# Patient Record
Sex: Male | Born: 1975 | Race: White | Hispanic: No | Marital: Single
Health system: Southern US, Community
[De-identification: ages and names within clinical notes are randomized; demographics above are authoritative.]

---

## 2018-06-26 ENCOUNTER — Other Ambulatory Visit: Payer: Self-pay

## 2018-06-26 ENCOUNTER — Encounter: Payer: Self-pay | Admitting: Emergency Medicine

## 2018-06-26 ENCOUNTER — Emergency Department
Admission: EM | Admit: 2018-06-26 | Discharge: 2018-06-26 | Disposition: A | Discharge status: Home / Self Care | Payer: Self-pay | Attending: Emergency Medicine | Admitting: Emergency Medicine

## 2018-06-26 ENCOUNTER — Emergency Department: Payer: Self-pay

## 2018-06-26 DIAGNOSIS — N50819 Testicular pain, unspecified: Secondary | ICD-10-CM

## 2018-06-26 DIAGNOSIS — N509 Disorder of male genital organs, unspecified: Secondary | ICD-10-CM | POA: Insufficient documentation

## 2018-06-26 DIAGNOSIS — N5089 Other specified disorders of the male genital organs: Secondary | ICD-10-CM

## 2018-06-26 DIAGNOSIS — N452 Orchitis: Secondary | ICD-10-CM

## 2018-06-26 LAB — CHLAMYDIA/NGC RT PCR (ARMC ONLY)
Chlamydia Tr: NOT DETECTED
N GONORRHOEAE: NOT DETECTED

## 2018-06-26 LAB — URINALYSIS, ROUTINE W REFLEX MICROSCOPIC
Bilirubin Urine: NEGATIVE
GLUCOSE, UA: NEGATIVE mg/dL
HGB URINE DIPSTICK: NEGATIVE
Ketones, ur: 5 mg/dL — AB
Leukocytes, UA: NEGATIVE
Nitrite: NEGATIVE
PROTEIN: NEGATIVE mg/dL
SPECIFIC GRAVITY, URINE: 1.008 (ref 1.005–1.030)
pH: 6 (ref 5.0–8.0)

## 2018-06-26 MED ORDER — LEVOFLOXACIN 500 MG PO TABS: 500.0000 mg | ORAL_TABLET | Freq: Every day | ORAL | 0 refills | Status: DC

## 2018-06-26 MED ORDER — LEVOFLOXACIN 750 MG PO TABS
750.0000 mg | ORAL_TABLET | Freq: Once | ORAL | Status: AC
  Administered 2018-06-26: 750 mg via ORAL
  Filled 2018-06-26: qty 1

## 2018-06-26 MED ORDER — LEVOFLOXACIN 500 MG PO TABS: 500.0000 mg | ORAL_TABLET | Freq: Every day | ORAL | 0 refills | Status: AC

## 2018-06-26 NOTE — ED Notes (Signed)
Pt given warm blanket and remote. Pt updated on wait time for test results

## 2018-06-26 NOTE — ED Notes (Signed)
Assisted MD Sharma CovertNorman with testicular exam. Pt tolerated well.

## 2018-06-26 NOTE — ED Provider Notes (Addendum)
[facility] Emergency Department Provider Note  ____________________________________________  Time seen: Approximately 3:23 PM  I have reviewed the triage vital signs and the nursing notes.   HISTORY  Chief Complaint Testicle Pain    HPI Antonio Gonzales is a 42 y.o. male the prior history of right hydrocele status post resection presenting with left testicular pain and mass.  The patient reports that for the past several weeks, he has noticed a small mass which is getting progressively larger on the superior surface of the left testicle.  Over the past 3 to 4 days, the patient has been having associated left testicular pain.  He has not had any penile discharge, nausea or vomiting, fevers or chills.  He reports he is sexually active with his wife only, and that he is in a monogamous relationship.  History reviewed. No pertinent past medical history.  There are no active problems to display for this patient.       Allergies Patient has no allergy information on record.  History reviewed. No pertinent family history.  Social History Social History   Tobacco Use  . Smoking status: Not on file  Substance Use Topics  . Alcohol use: Not on file  . Drug use: Not on file    Review of Systems Constitutional: No fever/chills. ENT: No sore throat. No congestion or rhinorrhea. Cardiovascular: Denies chest pain. Denies palpitations. Respiratory: Denies shortness of breath.  No cough. Gastrointestinal: No abdominal pain.  No nausea, no vomiting.  No diarrhea.  No constipation. Genitourinary: Negative for dysuria.  If or hematuria.  No penile discharge.  Positive left testicular mass which is progressively enlarging.  Positive left testicular pain.  Testicular injury. Musculoskeletal: Negative for back pain. Skin: Negative for rash. Neurological: Negative for headaches. No focal numbness, tingling or weakness.      ____________________________________________   PHYSICAL EXAM:  VITAL SIGNS: ED Triage Vitals  Enc Vitals Group     BP 06/26/18 1305 115/72     Pulse Rate 06/26/18 1305 67     Resp 06/26/18 1305 18     Temp 06/26/18 1305 98.4 F (36.9 C)     Temp Source 06/26/18 1305 Oral     SpO2 06/26/18 1305 98 %     Weight 06/26/18 1307 250 lb (113.4 kg)     Height 06/26/18 1307 5\' 10"  (1.778 m)     Head Circumference --      Peak Flow --      Pain Score 06/26/18 1305 3     Pain Loc --      Pain Edu? --      Excl. in GC? --     Constitutional: Alert and oriented. Answers questions appropriately. Eyes: Conjunctivae are normal.  EOMI. No scleral icterus. Head: Atraumatic. Nose: No congestion/rhinnorhea. Mouth/Throat: Mucous membranes are moist.  Neck: No stridor.  Supple.   Cardiovascular: Normal rate Respiratory: Normal respiratory effort.  Gastrointestinal: Soft, nontender and nondistended.  No guarding or rebound.  No peritoneal signs. Genitourinary: Normal-appearing external genitalia.  The penis is without any external lesions, and has no evidence of discharge.  The testicles have a normal lie with left testicle hanging lower than the right.  The right testicle is approximately two thirds the size of the left testicle.  The patient has no palpable masses on the testicles.  He does have some mild tenderness to palpation on the right testicle.  The patient has no palpable  inguinal hernias. Musculoskeletal: No LE edema. Neurologic:  A&Ox3.  Speech is clear.  Face and smile are symmetric.  EOMI.  Moves all extremities well. Skin:  Skin is warm, dry and intact. No rash noted. Psych: Normal mood and affect.  Good judgement.  ____________________________________________   LABS (all labs ordered are listed, but only abnormal results are displayed)  Labs Reviewed  URINALYSIS, ROUTINE W REFLEX MICROSCOPIC - Abnormal; Notable for the following components:      Result Value   Color,  Urine YELLOW (*)    APPearance CLEAR (*)    Ketones, ur 5 (*)    All other components within normal limits  CHLAMYDIA/NGC RT PCR [facility] ONLY)   ____________________________________________  EKG  None ____________________________________________  RADIOLOGY  US Scrotum W/doppler  Result Date: 06/26/2018 CLINICAL DATA:  42 y/o M; lung and pain of the left testicle for 2 weeks. EXAM: SCROTAL ULTRASOUND DOPPLER ULTRASOUND OF THE TESTICLES TECHNIQUE: Complete ultrasound examination of the testicles, epididymis, and other scrotal structures was performed. Color and spectral Doppler ultrasound were also utilized to evaluate blood flow to the testicles. COMPARISON:  None. FINDINGS: Right testicle Measurements: 4.6 x 1.8 x 3.4 cm. No microlithiasis. Irregularly marginated hypoechoic lesion within the pole of the testicle measuring 2.9 x 0.9 x 1.0 cm. Left testicle Measurements: 4.7 x 2.4 x 3.4 cm. No mass or microlithiasis visualized. Right epididymis:  Normal in size and appearance. Left epididymis:  Normal in size.  8 mm simple epididymal cyst. Hydrocele:  Small left hydrocele. Varicocele:  None visualized. Pulsed Doppler interrogation of both testes demonstrates normal low resistance arterial and venous waveforms are present bilaterally. There is asymmetrically increased blood flow within the left testicle on color Doppler. IMPRESSION: 1. Mildly increased vascularity of the left testicle on color Doppler compatible with orchitis. No findings of torsion at this time. 2. Irregularly marginated hypoechoic lesion in the right testicle measuring up to 2.9 cm. Findings may represent sequelae of old trauma, infarct, or hematoma. Testicular carcinoma is not excluded. Clinical correlation recommended. Electronically Signed   By: Mitzi Hansen M.D.   On: 06/26/2018 14:06    ____________________________________________   PROCEDURES  Procedure(s) performed: None  Procedures  Critical  Care performed: No ____________________________________________   INITIAL IMPRESSION / ASSESSMENT AND PLAN / ED COURSE  Pertinent labs & imaging results that were available during my care of the patient were reviewed by me and considered in my medical decision making (see chart for details).  42 y.o. male with a history of prior right hydrocele removal presenting with left testicular pain, and enlarging mass.  Overall, the patient is hemodynamically stable and has no clinical exam findings or history that is suggestive of a toxic infection.  His testicular exam is not consistent with testicular torsion.  His ultrasound does show increased blood flow of the left testicle which would be consistent with orchitis.  The patient states that he is monogamous, as is his wife, so we will get gonorrhea and Chlamydia testing to help determine antibiotic choice.  The patient also has a 2.9 cm mass in the right; this could be from his prior hydrocele surgery, but I have encouraged him to follow-up with a urologist as soon as he returns back to [place] where he is from, within the next 2 to 3 weeks, for evaluation by urologist; he understands the small risk that a mass in the testicle could be a carcinoma.  ----------------------------------------- 5:22 PM on 06/26/2018 -----------------------------------------  The patient's gonorrhea and Chlamydia testing are negative.  At this time, we  will treat him for GU pathogens and he will be placed on a 10-day course of Levaquin.  I have discussed follow-up instructions as well as return precautions with the patient.  ____________________________________________  FINAL CLINICAL IMPRESSION(S) / ED DIAGNOSES  Final diagnoses:  Orchitis  Mass of right testicle         NEW MEDICATIONS STARTED DURING THIS VISIT:  New Prescriptions   No medications on file      Rockne MenghiniNorman, Anne-Caroline, MD 06/26/18 1529    Rockne MenghiniNorman, Anne-Caroline, MD 06/26/18 [phone]

## 2018-06-26 NOTE — ED Notes (Addendum)
See triage note. Pt c/o pain in left testicle x 1 week; progressively "more sore and throbbing."

## 2018-06-26 NOTE — ED Triage Notes (Signed)
Pt presents to ED via POV with c/o of left testicular pain and lump. Pt states lump has been present for about 6 months, but shooting pain started two days ago that radiates to his stomach. 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 Pt in NAD at this time.

## 2018-06-26 NOTE — ED Notes (Signed)
Per lab, they will add-on chlamydia/NGC to urine already sent down. MD aware.

## 2018-06-26 NOTE — Discharge Instructions (Addendum)
Today you will be treated for your testicular infection with Levaquin; please take the entire course even if you are feeling better.  If your symptoms persist, you may need a longer course and your urologist can write you for an extension of the prescription.  Please also follow-up with the urologist regarding your abnormal ultrasound which showed a 2.9 cm lesion on your right testicle; it is likely that this is from your prior surgery but other possibilities include cancer and will need to be evaluated.  Please call medical records to have your records printed or sent to your urologist for follow-up.  Return to the emergency department if you develop severe pain, changes in the color or size of your scrotum, lightheadedness or fainting, fever, or any other symptoms concerning to you.

## 2020-04-17 IMAGING — US US SCROTUM W/ DOPPLER COMPLETE
1 series · 13 of 25 positions shown · non-contrast
Comparison: None.

CLINICAL DATA: 42 y/o M; lung and pain of the left testicle for 2
weeks.

EXAM:
SCROTAL ULTRASOUND
DOPPLER ULTRASOUND OF THE TESTICLES
TECHNIQUE: Complete ultrasound examination of the testicles, epididymis, and
other scrotal structures was performed. Color and spectral Doppler
ultrasound were also utilized to evaluate blood flow to the
testicles.

[Series 1: us scrotum w/ doppler complete · 0.07mm/px · 13 of 104 slices shown]
[im 1/104]
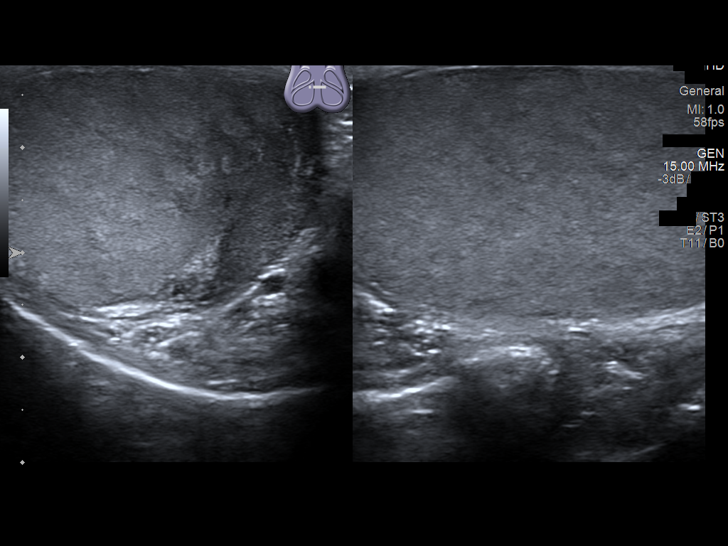
[im 9/104]
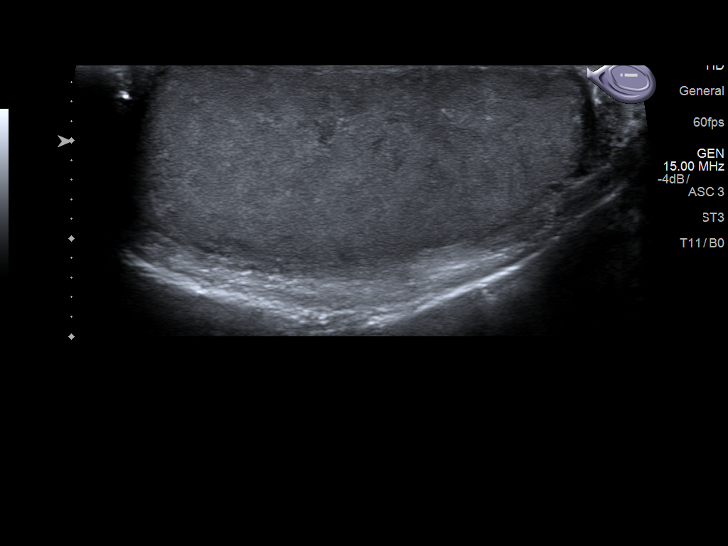
[im 18/104]
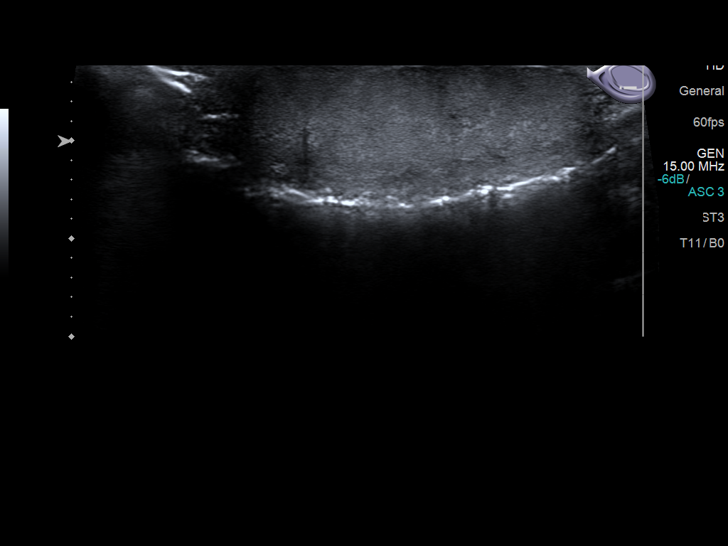
[im 26/104]
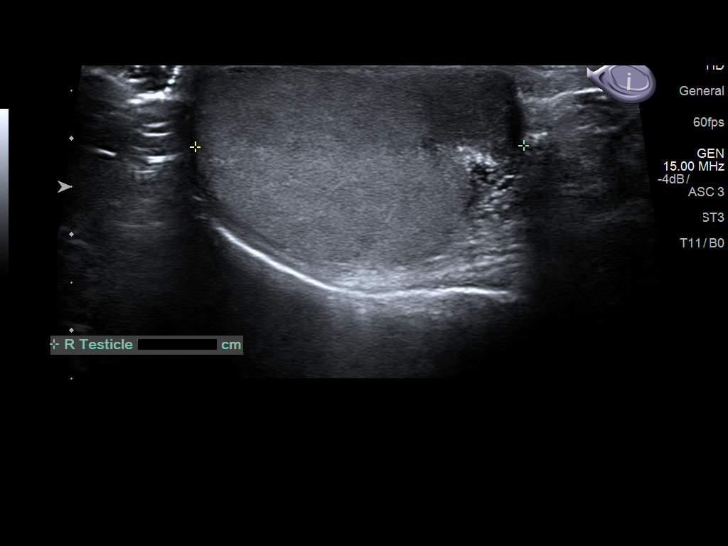
[im 35/104]
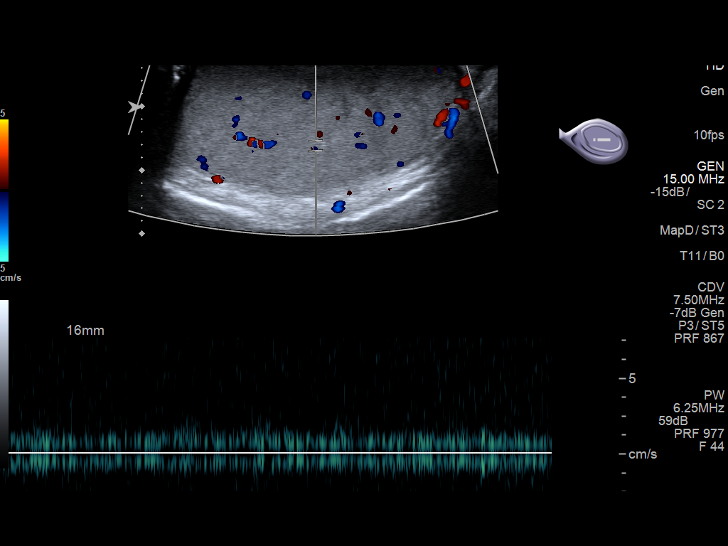
[im 43/104]
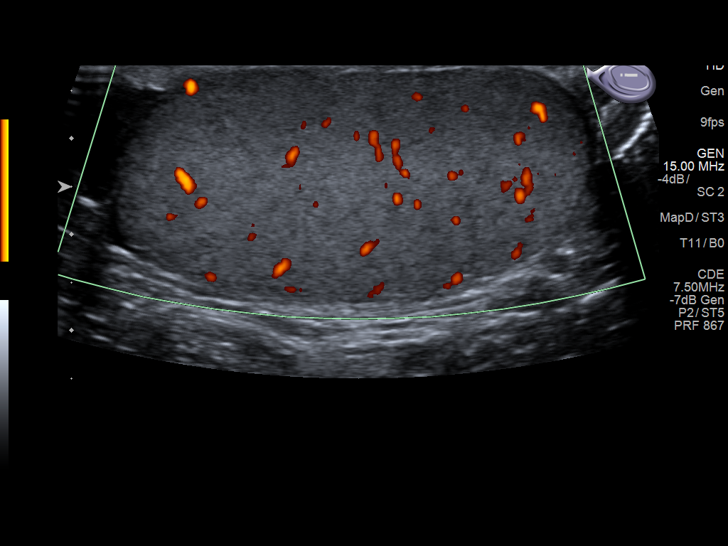
[im 52/104]
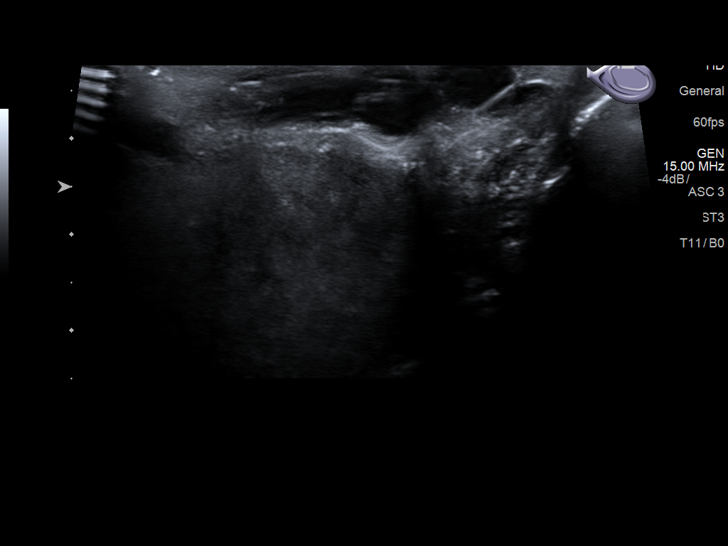
[im 61/104]
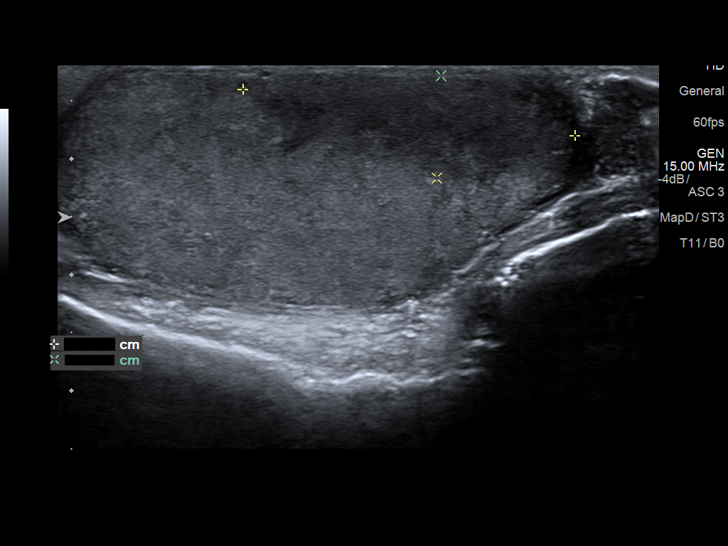
[im 69/104]
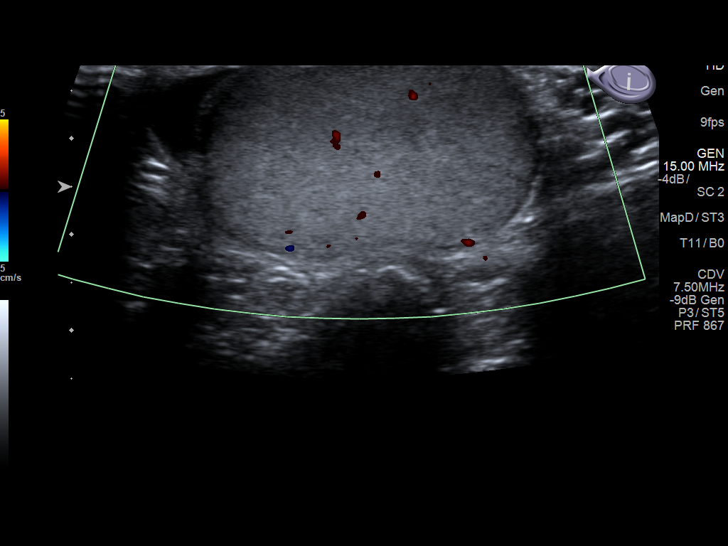
[im 78/104]
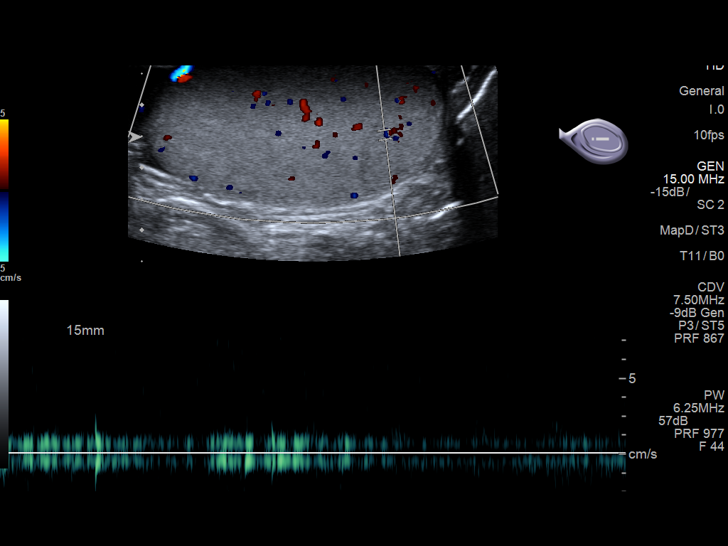
[im 86/104]
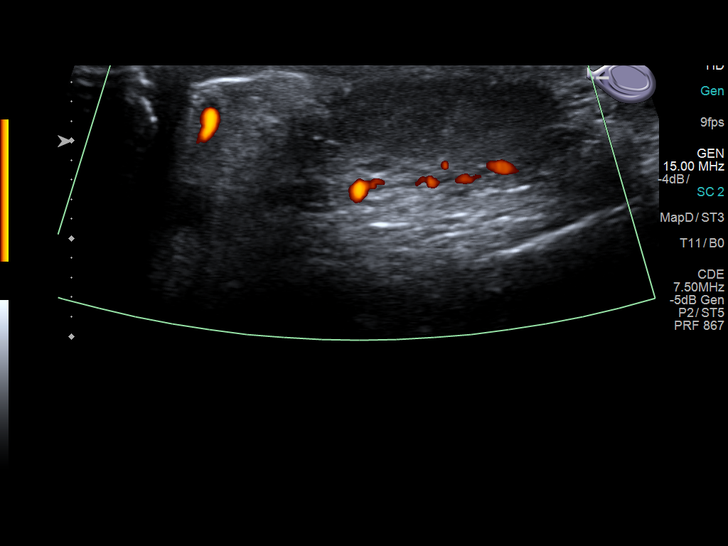
[im 95/104]
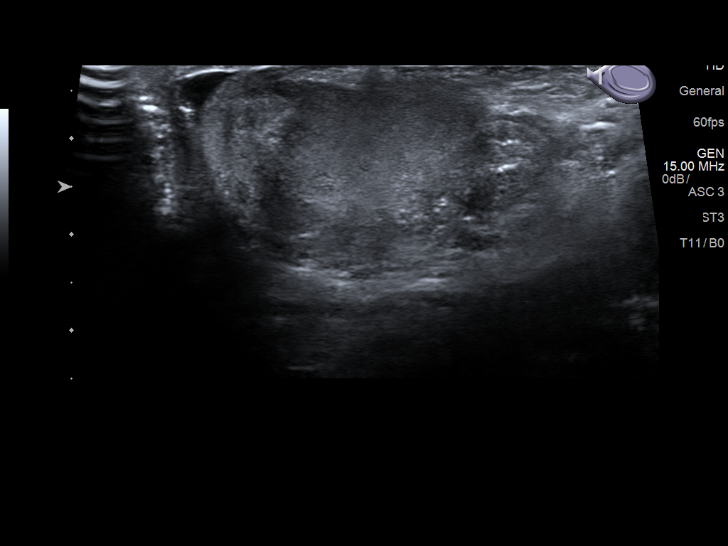
[im 104/104]
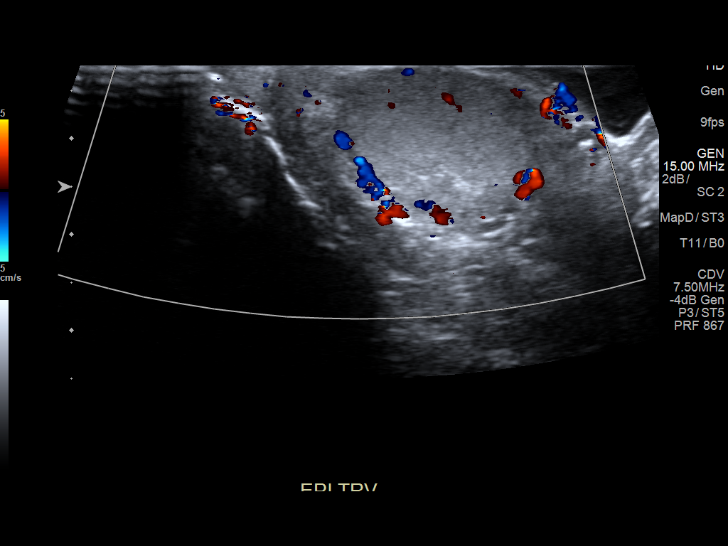

[13 of 25 positions shown; findings below may reference images not displayed]

FINDINGS: Right testicle

Measurements: 4.6 x 1.8 x 3.4 cm. No microlithiasis. Irregularly
marginated hypoechoic lesion within the pole of the testicle
measuring 2.9 x 0.9 x 1.0 cm.

Left testicle

Measurements: 4.7 x 2.4 x 3.4 cm. No mass or microlithiasis
visualized.

Right epididymis:  Normal in size and appearance.

Left epididymis:  Normal in size.  8 mm simple epididymal cyst.

Hydrocele:  Small left hydrocele.

Varicocele:  None visualized.

Pulsed Doppler interrogation of both testes demonstrates normal low
resistance arterial and venous waveforms are present bilaterally.
There is asymmetrically increased blood flow within the left
testicle on color Doppler.
IMPRESSION: 1. Mildly increased vascularity of the left testicle on color
Doppler compatible with orchitis. No findings of torsion at this
time.
2. Irregularly marginated hypoechoic lesion in the right testicle
measuring up to 2.9 cm. Findings may represent sequelae of old
trauma, infarct, or hematoma. Testicular carcinoma is not excluded.
Clinical correlation recommended.

By: Carla E Carlos Sulva M.D.

## 2020-06-08 ENCOUNTER — Inpatient Hospital Stay: Admit: 2020-06-08 | Discharge: 2020-06-08 | Disposition: A | Discharge status: Home / Self Care

## 2020-06-08 DIAGNOSIS — R5383 Other fatigue: Secondary | ICD-10-CM

## 2020-06-08 LAB — CBC WITH AUTO DIFFERENTIAL
Absolute Baso #: 0.1 10*3/uL (ref 0.0–0.2)
Absolute Eos #: 0.2 10*3/uL (ref 0.0–0.5)
Absolute Lymph #: 2.6 10*3/uL (ref 1.0–4.3)
Absolute Mono #: 0.7 10*3/uL (ref 0.0–0.8)
Absolute Neut #: 4.8 10*3/uL (ref 1.8–7.0)
Basophils: 0.6 % (ref 0.0–2.0)
Eosinophils: 2.5 % (ref 1.0–6.0)
Granulocytes %: 57.4 % (ref 40.0–80.0)
Hematocrit: 44.6 % (ref 40.0–52.0)
Hemoglobin: 14.8 g/dL (ref 13.0–18.0)
Lymphocyte %: 30.9 % (ref 20.0–40.0)
MCH: 30.5 pg (ref 26.0–34.0)
MCHC: 33.1 % (ref 32.0–36.0)
MCV: 92 fL (ref 80.0–98.0)
MPV: 9.1 fL (ref 7.4–10.4)
Monocytes: 8.6 % (ref 2.0–10.0)
Platelets: 208 10*3/uL (ref 140–440)
RBC: 4.85 10*6/uL (ref 4.40–5.90)
RDW: 14.1 % (ref 11.5–14.5)
WBC: 8.4 10*3/uL (ref 3.6–10.7)

## 2020-06-08 LAB — COMPREHENSIVE METABOLIC PANEL
ALT: 23 U/L (ref 0–49)
AST: 31 U/L (ref 15–46)
Albumin,Serum: 4.7 g/dL (ref 3.5–5.0)
Alkaline Phosphatase: 54 U/L (ref 38–126)
Anion Gap: 12 mmol/L (ref 3–13)
BUN: 15 mg/dL (ref 7–20)
CO2: 24 mmol/L (ref 22–30)
Calcium: 9.1 mg/dL (ref 8.4–10.4)
Chloride: 110 mmol/L — ABNORMAL HIGH (ref 98–107)
Creatinine: 1.11 mg/dL (ref 0.52–1.25)
EGFR IF NonAfrican American: 79.2 mL/min (ref >60)
Glucose: 100 mg/dL (ref 70–100)
Potassium: 4.5 mmol/L (ref 3.5–5.1)
Sodium: 145 mmol/L (ref 135–145)
Total Bilirubin: 0.2 mg/dL (ref 0.2–1.3)
Total Protein: 8.2 g/dL (ref 6.3–8.2)
eGFR African American: >90 mL/min (ref >60)

## 2020-06-08 MED ORDER — NORMAL SALINE FLUSH 0.9 % IV SOLN
0.9 | Freq: Three times a day (TID) | INTRAVENOUS | Status: DC
Start: 2020-06-08 — End: 2020-06-08

## 2020-06-08 NOTE — ED Notes (Signed)
Pt was placed on monitor w/alarms set,NBP on,pulse ox on and discharge vitals obtained. pt A/O x3. skin W/D.  No acute respiratory or cardiac distress noted.  Pt was given HGI,F/U inst and verb understanding. Pt was discharged alert/amb with upright steady gait. W/C offer declined.     Theophilus Kinds, RN  06/08/20 [phone]
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
## 2020-06-08 NOTE — ED Notes (Signed)
Bed: 09  Expected date:   Expected time:   Means of arrival:   Comments:  EMS Off Load     Barbara Cower D. Simeon Craft, RN  06/08/20 [phone]
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
